# Patient Record
Sex: Male | Born: 1975 | Hispanic: No | Marital: Married
Health system: Southern US, Community
[De-identification: ages and names within clinical notes are randomized; demographics above are authoritative.]

---

## 2011-05-28 ENCOUNTER — Ambulatory Visit: Payer: Self-pay | Admitting: Family Medicine

## 2012-01-20 ENCOUNTER — Ambulatory Visit
Admission: RE | Admit: 2012-01-20 | Discharge: 2012-01-20 | Disposition: A | Discharge status: Home / Self Care | Payer: Self-pay | Source: Ambulatory Visit | Attending: Family Medicine | Admitting: Family Medicine

## 2012-01-20 ENCOUNTER — Other Ambulatory Visit: Payer: Self-pay | Admitting: Family Medicine

## 2012-01-20 DIAGNOSIS — M7989 Other specified soft tissue disorders: Secondary | ICD-10-CM

## 2013-09-21 IMAGING — CR DG HAND COMPLETE 3+V*R*
3 series · 3 of 3 positions shown · non-contrast
Comparison: None

CLINICAL DATA: Pain and swelling.

RIGHT HAND - COMPLETE 3+ VIEW

[x hand pa right]
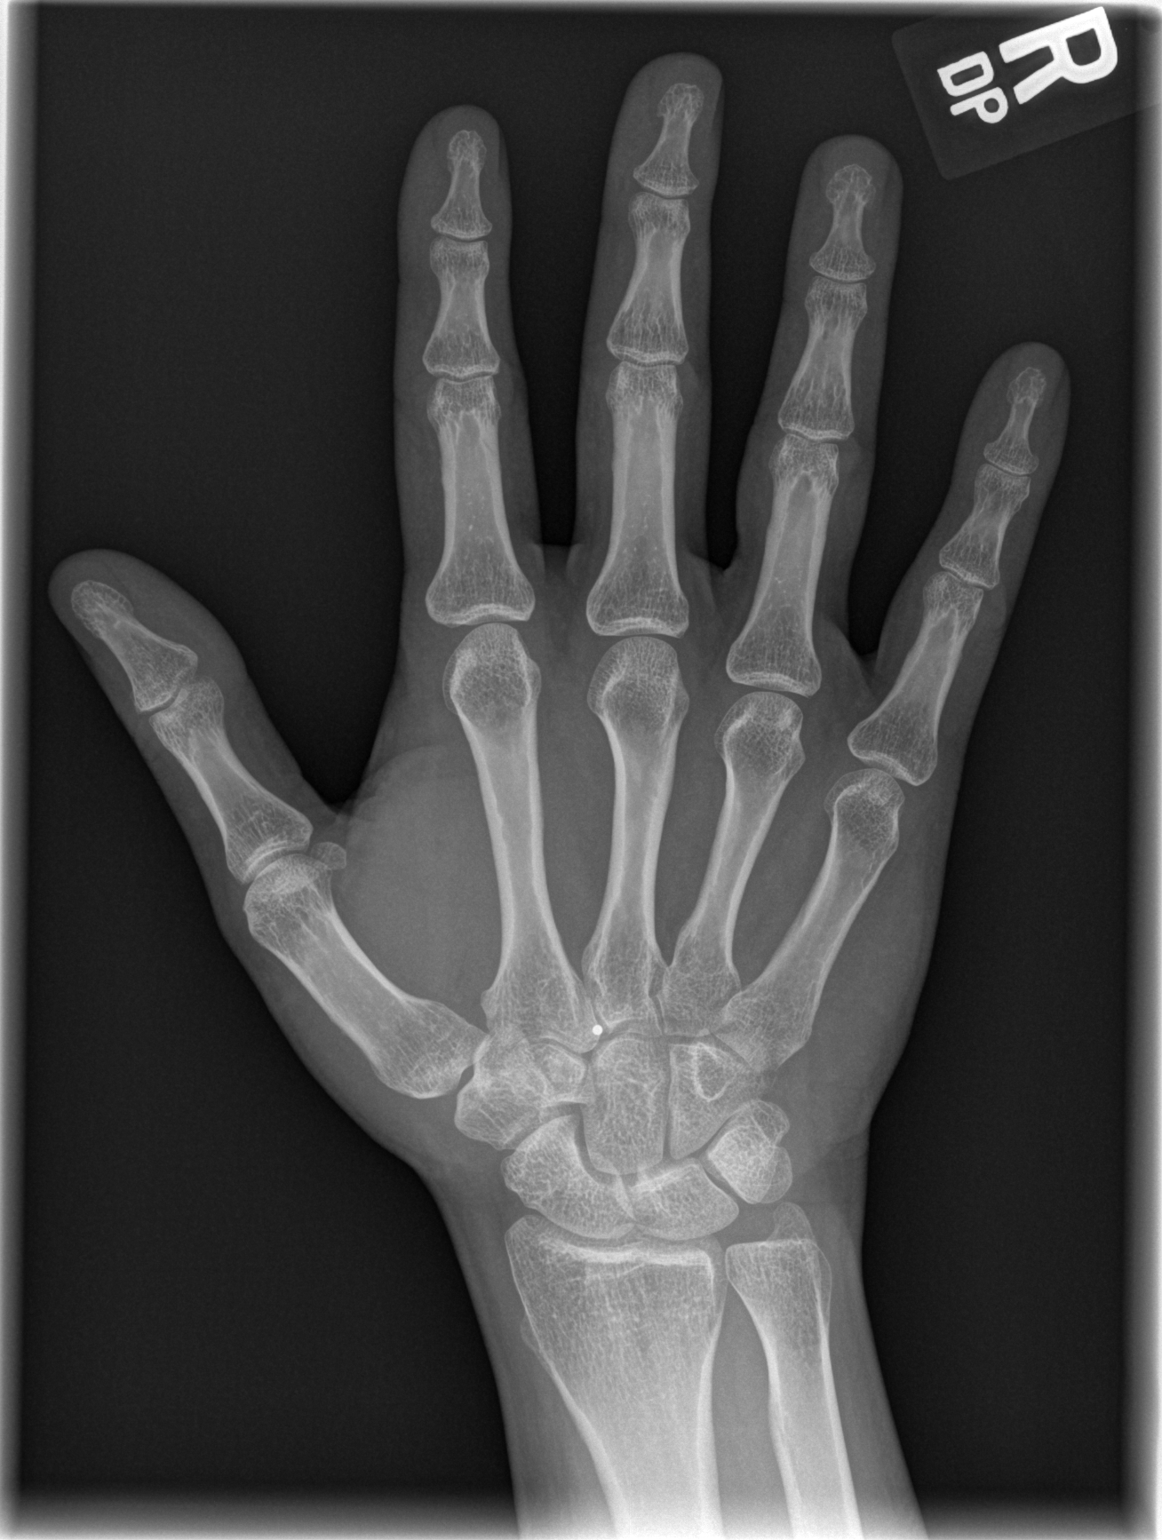

[x hand oblique right]
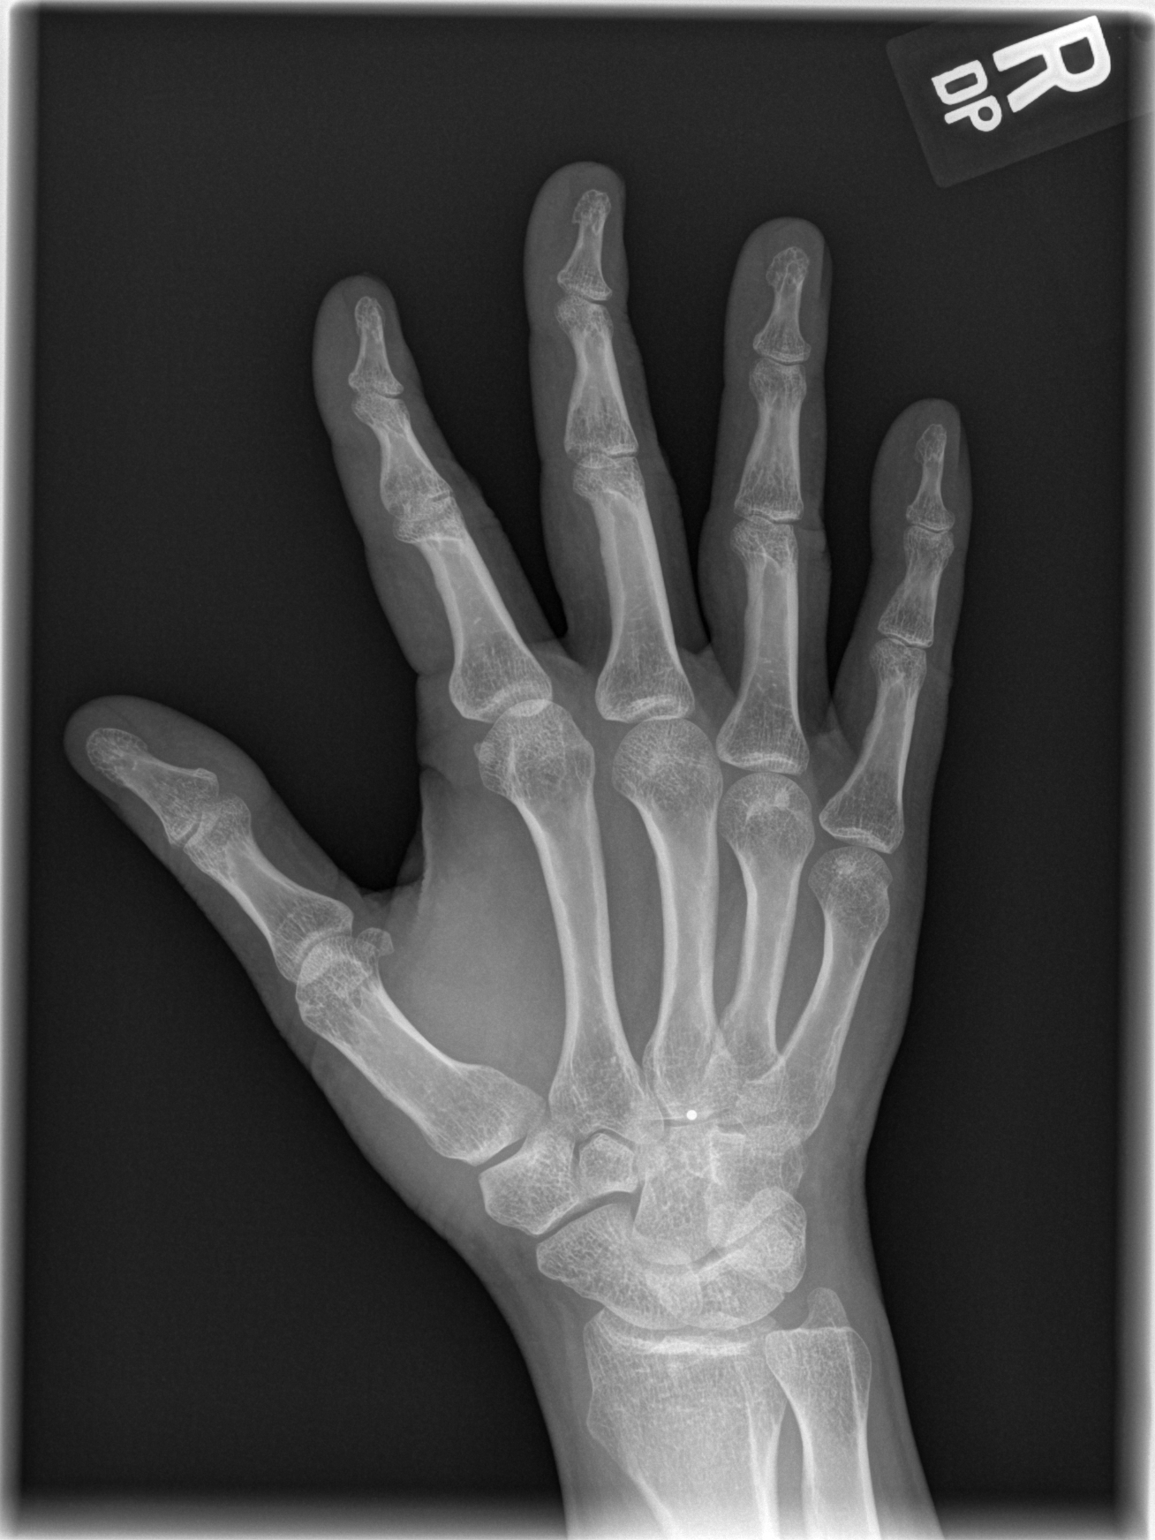

[x hand lat right]
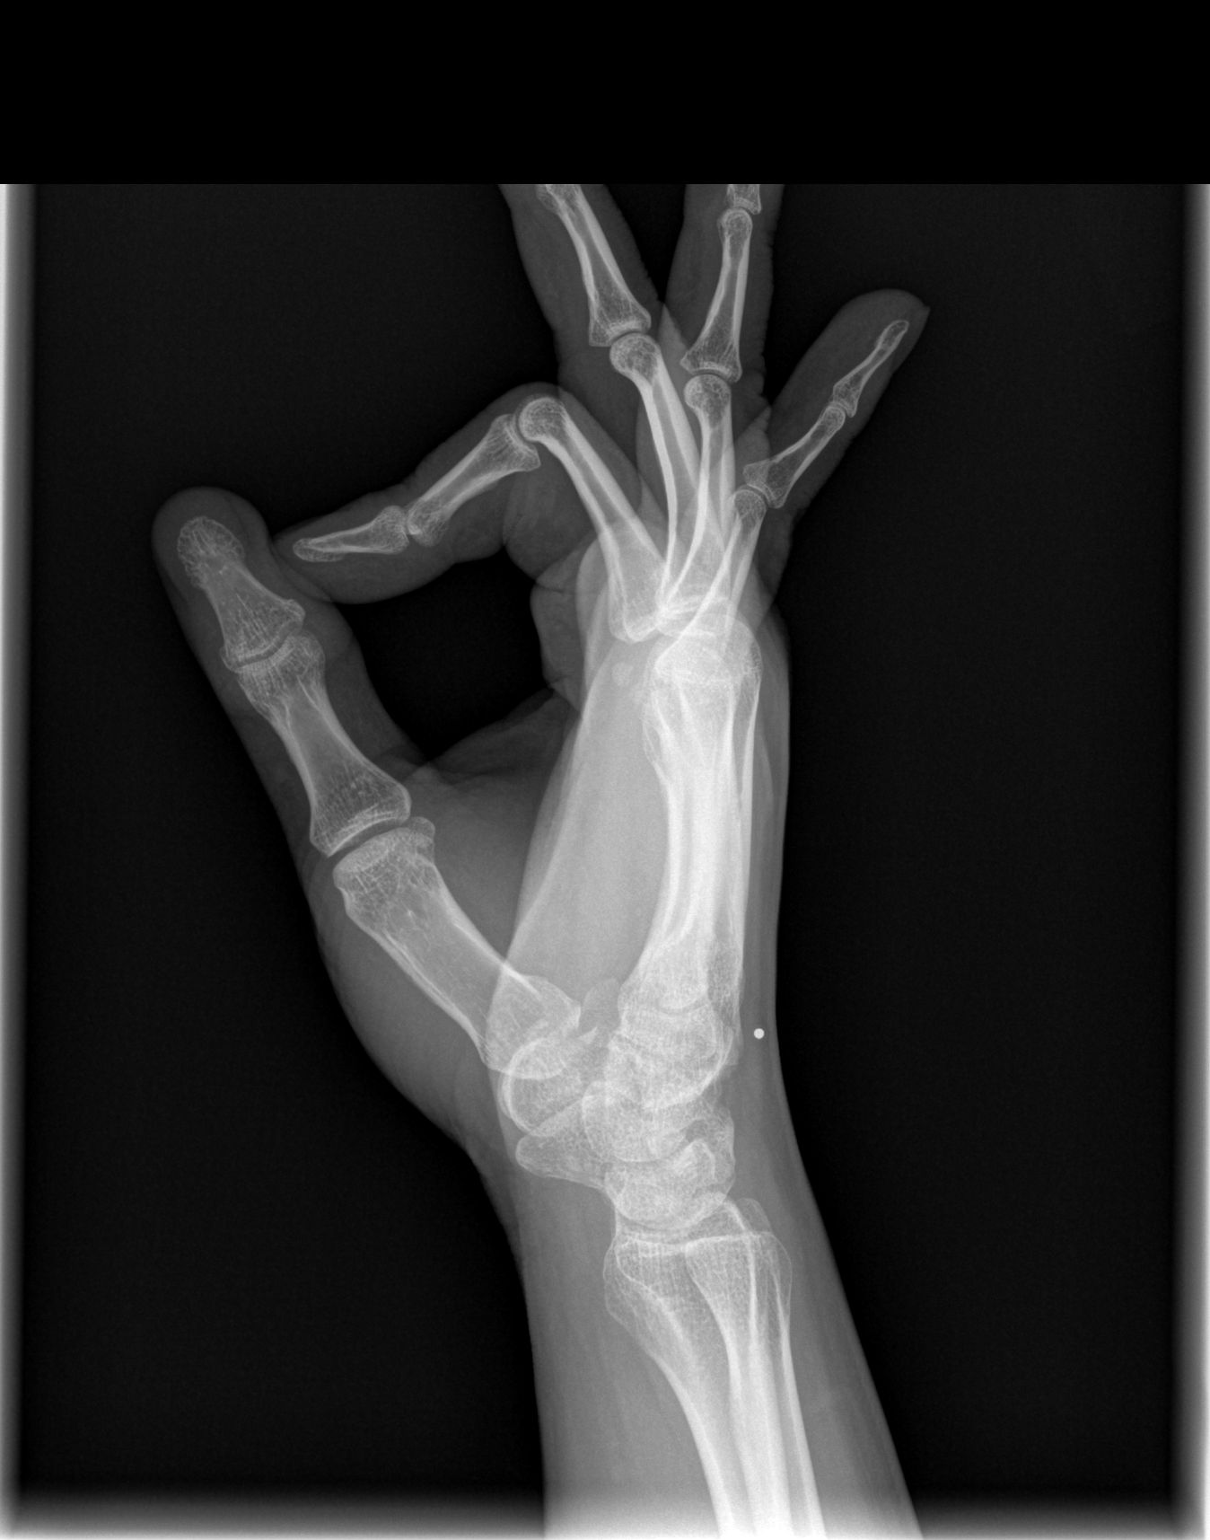

[3 of 3 positions shown; findings below may reference images not displayed]

FINDINGS: The joint spaces are maintained.  No acute bony findings
or arthropathic changes.  Mild bony protuberance located dorsally
at the base of the metacarpals.  This could be a meta[REDACTED].
IMPRESSION: 1.  No acute bony findings.
2.  Possible meta[REDACTED] accounting for the patient's palpable
abnormality.

## 2019-06-14 ENCOUNTER — Ambulatory Visit: Payer: BC Managed Care – PPO | Attending: Family

## 2019-06-14 DIAGNOSIS — Z23 Encounter for immunization: Secondary | ICD-10-CM

## 2019-06-14 NOTE — Progress Notes (Signed)
   Covid-19 Vaccination Clinic  Name:  Alexander Kim    MRN: [id number] DOB: [birth year]  06/14/2019  [name] was observed post Covid-19 immunization for 15 minutes without incident. He was provided with Vaccine Information Sheet and instruction to access the V-Safe system.   [name] was instructed to call 911 with any severe reactions post vaccine: Marland Kitchen Difficulty breathing  . Swelling of face and throat  . A fast heartbeat  . A bad rash all over body  . Dizziness and weakness   Immunizations Administered    Name Date Dose VIS Date Route   Moderna COVID-19 Vaccine 06/14/2019  1:14 PM 0.5 mL 03/06/2019 Intramuscular   Manufacturer: Moderna   Lot: [id number]   NDC: 94765-465-03

## 2019-07-17 ENCOUNTER — Ambulatory Visit: Payer: BC Managed Care – PPO | Attending: Family

## 2019-07-17 DIAGNOSIS — Z23 Encounter for immunization: Secondary | ICD-10-CM

## 2019-07-17 NOTE — Progress Notes (Signed)
   Covid-19 Vaccination Clinic  Name:  Alexander Kim    MRN: [id number] DOB: December 27, 1975  07/17/2019  [name] was observed post Covid-19 immunization for 15 minutes without incident. He was provided with Vaccine Information Sheet and instruction to access the V-Safe system.   [name] was instructed to call 911 with any severe reactions post vaccine: Marland Kitchen Difficulty breathing  . Swelling of face and throat  . A fast heartbeat  . A bad rash all over body  . Dizziness and weakness   Immunizations Administered    Name Date Dose VIS Date Route   Moderna COVID-19 Vaccine 07/17/2019 10:01 AM 0.5 mL 03/06/2019 Intramuscular   Manufacturer: Moderna   Lot: [id number]   NDC: 68864-847-20

## 2021-03-06 ENCOUNTER — Ambulatory Visit: Payer: Self-pay | Attending: Internal Medicine

## 2021-03-06 DIAGNOSIS — Z23 Encounter for immunization: Secondary | ICD-10-CM

## 2021-03-06 NOTE — Progress Notes (Signed)
   Covid-19 Vaccination Clinic  Name:  Mitch Arquette    MRN: [id number] DOB: 08/16/1975  03/06/2021  Mr. Clingan was observed post Covid-19 immunization for 15 minutes without incident. He was provided with Vaccine Information Sheet and instruction to access the V-Safe system.   Mr. Hlavaty was instructed to call 911 with any severe reactions post vaccine: Difficulty breathing  Swelling of face and throat  A fast heartbeat  A bad rash all over body  Dizziness and weakness   Immunizations Administered     Name Date Dose VIS Date Route   Moderna Covid-19 vaccine Bivalent Booster 03/06/2021 10:10 AM 0.5 mL 11/15/2020 Intramuscular   Manufacturer: Gala Murdoch   Lot: [id number]   NDC: 48592-763-94

## 2021-03-07 ENCOUNTER — Other Ambulatory Visit (HOSPITAL_BASED_OUTPATIENT_CLINIC_OR_DEPARTMENT_OTHER): Payer: Self-pay

## 2021-03-07 MED ORDER — MODERNA COVID-19 BIVAL BOOSTER 50 MCG/0.5ML IM SUSP
INTRAMUSCULAR | 0 refills | Status: AC
  Filled 2021-03-07: qty 0.5, 1d supply, fill #0

## 2021-03-09 ENCOUNTER — Other Ambulatory Visit (HOSPITAL_BASED_OUTPATIENT_CLINIC_OR_DEPARTMENT_OTHER): Payer: Self-pay

## 2022-09-20 ENCOUNTER — Emergency Department (HOSPITAL_BASED_OUTPATIENT_CLINIC_OR_DEPARTMENT_OTHER)
Admission: EM | Admit: 2022-09-20 | Discharge: 2022-09-20 | Disposition: A | Discharge status: Home / Self Care | Payer: BC Managed Care – PPO | Attending: Emergency Medicine | Admitting: Emergency Medicine

## 2022-09-20 ENCOUNTER — Other Ambulatory Visit: Payer: Self-pay

## 2022-09-20 DIAGNOSIS — R55 Syncope and collapse: Secondary | ICD-10-CM | POA: Diagnosis not present

## 2022-09-20 DIAGNOSIS — R42 Dizziness and giddiness: Secondary | ICD-10-CM | POA: Diagnosis present

## 2022-09-20 LAB — COMPREHENSIVE METABOLIC PANEL
ALT: 54 U/L — ABNORMAL HIGH (ref 0–44)
AST: 39 U/L (ref 15–41)
Albumin: 4.3 g/dL (ref 3.5–5.0)
Alkaline Phosphatase: 75 U/L (ref 38–126)
Anion gap: 9 (ref 5–15)
BUN: 13 mg/dL (ref 6–20)
CO2: 22 mmol/L (ref 22–32)
Calcium: 8.8 mg/dL — ABNORMAL LOW (ref 8.9–10.3)
Chloride: 106 mmol/L (ref 98–111)
Creatinine, Ser: 0.93 mg/dL (ref 0.61–1.24)
GFR, Estimated: >60 mL/min (ref >60)
Glucose, Bld: 102 mg/dL — ABNORMAL HIGH (ref 70–99)
Potassium: 3.3 mmol/L — ABNORMAL LOW (ref 3.5–5.1)
Sodium: 137 mmol/L (ref 135–145)
Total Bilirubin: 0.8 mg/dL (ref 0.3–1.2)
Total Protein: 8.1 g/dL (ref 6.5–8.1)

## 2022-09-20 LAB — CBC WITH DIFFERENTIAL/PLATELET
Abs Immature Granulocytes: 0.03 10*3/uL (ref 0.00–0.07)
Basophils Absolute: 0 10*3/uL (ref 0.0–0.1)
Basophils Relative: 0 %
Eosinophils Absolute: 0.2 10*3/uL (ref 0.0–0.5)
Eosinophils Relative: 2 %
HCT: 44.4 % (ref 39.0–52.0)
Hemoglobin: 15.1 g/dL (ref 13.0–17.0)
Immature Granulocytes: 0 %
Lymphocytes Relative: 37 %
Lymphs Abs: 3.5 10*3/uL (ref 0.7–4.0)
MCH: 29 pg (ref 26.0–34.0)
MCHC: 34 g/dL (ref 30.0–36.0)
MCV: 85.2 fL (ref 80.0–100.0)
Monocytes Absolute: 0.7 10*3/uL (ref 0.1–1.0)
Monocytes Relative: 7 %
Neutro Abs: 4.9 10*3/uL (ref 1.7–7.7)
Neutrophils Relative %: 54 %
Platelets: 224 10*3/uL (ref 150–400)
RBC: 5.21 MIL/uL (ref 4.22–5.81)
RDW: 13.2 % (ref 11.5–15.5)
Smear Review: NORMAL
WBC: 9.5 10*3/uL (ref 4.0–10.5)
nRBC: 0 % (ref 0.0–0.2)

## 2022-09-20 NOTE — ED Triage Notes (Signed)
Pt states he has been feeling dizzy since about June 8th. Pt also having blood pressure changes with it being now. Pt changed his diet due to religious reasons. Denies any chest pain or sob.

## 2022-09-20 NOTE — ED Provider Notes (Signed)
[facility] Provider Note   CSN: [id number] Arrival date & time: 09/20/22  1434     History  Chief Complaint  Patient presents with   Dizziness    Alexander Kim is a 47 y.o. male.  Patient here with concerns for dehydration.  He took part in a 20-day ritual in [place] while there after his father's death.  Mostly a vegetarian diet not much water.  Not much protein.  He was working remotely as well and not getting much sleep.  He is felt weak and lightheaded.  Blood pressure has been low at home with wife.  Denies any nausea vomiting diarrhea.  Denies any headache, chest pain, shortness of breath.  He has been feeling better the last few days.  He has been home for about 4 days now.  The history is provided by the patient.       Home Medications Prior to Admission medications   Medication Sig Start Date End Date Taking? Authorizing Provider  amLODipine (NORVASC) 5 MG tablet Take 5 mg by mouth daily.   Yes [provider]  COVID-19 mRNA bivalent vaccine, Moderna, (MODERNA COVID-19 BIVAL BOOSTER) 50 MCG/0.5ML injection Inject into the muscle. 03/06/21   Judyann Munson, MD      Allergies    Patient has no known allergies.    Review of Systems   Review of Systems  Physical Exam Updated Vital Signs BP 136/85   Pulse (!) 50   Temp 97.7 F (36.5 C)   Resp 18   SpO2 100%  Physical Exam Vitals and nursing note reviewed.  Constitutional:      General: He is not in acute distress.    Appearance: He is well-developed.  HENT:     Head: Normocephalic and atraumatic.     Nose: Nose normal.     Mouth/Throat:     Mouth: Mucous membranes are moist.  Eyes:     Extraocular Movements: Extraocular movements intact.     Conjunctiva/sclera: Conjunctivae normal.     Pupils: Pupils are equal, round, and reactive to light.  Cardiovascular:     Rate and Rhythm: Normal rate and regular rhythm.     Pulses: Normal pulses.     Heart  sounds: Normal heart sounds. No murmur heard. Pulmonary:     Effort: Pulmonary effort is normal. No respiratory distress.     Breath sounds: Normal breath sounds.  Abdominal:     Palpations: Abdomen is soft.     Tenderness: There is no abdominal tenderness.  Musculoskeletal:        General: No swelling.     Cervical back: Neck supple.  Skin:    General: Skin is warm and dry.     Capillary Refill: Capillary refill takes less than 2 seconds.  Neurological:     General: No focal deficit present.     Mental Status: He is alert and oriented to person, place, and time.     Cranial Nerves: No cranial nerve deficit.     Sensory: No sensory deficit.     Motor: No weakness.     Coordination: Coordination normal.     Gait: Gait normal.     Comments: 5+ out of 5 strength throughout, normal sensation, no drift, normal finger-nose-finger, normal speech  Psychiatric:        Mood and Affect: Mood normal.     ED Results / Procedures / Treatments   Labs (all labs ordered are listed, but only  abnormal results are displayed) Labs Reviewed  COMPREHENSIVE METABOLIC PANEL - Abnormal; Notable for the following components:      Result Value   Potassium 3.3 (*)    Glucose, Bld 102 (*)    Calcium 8.8 (*)    ALT 54 (*)    All other components within normal limits  CBC WITH DIFFERENTIAL/PLATELET    EKG EKG Interpretation  Date/Time:  Monday September 20 2022 14:50:29 EDT Ventricular Rate:  55 PR Interval:  165 QRS Duration: 113 QT Interval:  457 QTC Calculation: 438 R Axis:   21 Text Interpretation: Sinus rhythm Confirmed by Virgina Norfolk ([id number]) on 09/20/2022 3:08:05 PM  Radiology No results found.  Procedures Procedures    Medications Ordered in ED Medications - No data to display  ED Course/ Medical Decision Making/ A&P                             Medical Decision Making Amount and/or Complexity of Data Reviewed Labs: ordered.   Tameka Frankfort is here with generalized weakness.   Normal vitals.  No fever.  EKG shows sinus bradycardia.  No ischemic changes.  No heart block.  Vital signs are unremarkable here.  Well-appearing.  Was having some low blood pressure at home and concern for may be dehydration.  Just took part in some sort of 20-day fast.  CBC, CMP ordered and are unremarkable per my review and interpretation.  Kidney function is normal.  Electrolytes unremarkable.  EKG is reassuring.  He is not having any chest pain or shortness of breath or headache.  Neurologic exam is normal.  Orthostatics are normal.  Will have him continue to hold blood pressure medicine for a few more days given some low blood pressure readings at home.  I think he is starting to do better since we adjusted back to his normal diet.  Recommend continued hydration.  Understands return precautions.  Discharged in good condition.  This chart was dictated using voice recognition software.  Despite best efforts to proofread,  errors can occur which can change the documentation meaning.         Final Clinical Impression(s) / ED Diagnoses Final diagnoses:  Near syncope    Rx / DC Orders ED Discharge Orders     None         Virgina Norfolk, DO 09/20/22 1647

## 2022-09-20 NOTE — Discharge Instructions (Signed)
Hold blood pressure medicine until about Thursday.  Continue hydration and nutrition at home.  Return if symptoms worsen including severe chest pain, severe headache, severe shortness of breath.  Follow-up with your primary care doctor.
